# Patient Record
Sex: Male | Born: 1950 | Race: White | Hispanic: No | Marital: Married
Health system: Southern US, Community
[De-identification: ages and names within clinical notes are randomized; demographics above are authoritative.]

---

## 2004-12-08 ENCOUNTER — Emergency Department: Payer: Self-pay | Admitting: Unknown Physician Specialty

## 2013-05-12 ENCOUNTER — Inpatient Hospital Stay: Payer: Self-pay | Admitting: Internal Medicine

## 2013-05-12 LAB — COMPREHENSIVE METABOLIC PANEL
Alkaline Phosphatase: 295 U/L — ABNORMAL HIGH (ref 50–136)
Anion Gap: 10 (ref 7–16)
Bilirubin,Total: 7 mg/dL — ABNORMAL HIGH (ref 0.2–1.0)
Co2: 23 mmol/L (ref 21–32)
Creatinine: 0.99 mg/dL (ref 0.60–1.30)
EGFR (African American): >60
EGFR (Non-African Amer.): >60
Glucose: 222 mg/dL — ABNORMAL HIGH (ref 65–99)
Osmolality: 277 (ref 275–301)
Potassium: 3.8 mmol/L (ref 3.5–5.1)
SGOT(AST): 292 U/L — ABNORMAL HIGH (ref 15–37)
SGPT (ALT): 399 U/L — ABNORMAL HIGH (ref 12–78)
Total Protein: 7.7 g/dL (ref 6.4–8.2)

## 2013-05-12 LAB — CBC WITH DIFFERENTIAL/PLATELET
Basophil #: 0 10*3/uL (ref 0.0–0.1)
Eosinophil #: 0 10*3/uL (ref 0.0–0.7)
Eosinophil %: 0 %
HCT: 48.8 % (ref 40.0–52.0)
HGB: 16.9 g/dL (ref 13.0–18.0)
Lymphocyte #: 1 10*3/uL (ref 1.0–3.6)
Lymphocyte %: 9.9 %
MCV: 92 fL (ref 80–100)
Monocyte #: 0.6 x10 3/mm (ref 0.2–1.0)
Neutrophil #: 9 10*3/uL — ABNORMAL HIGH (ref 1.4–6.5)
Neutrophil %: 84.5 %
RBC: 5.31 10*6/uL (ref 4.40–5.90)
RDW: 14.1 % (ref 11.5–14.5)
WBC: 10.6 10*3/uL (ref 3.8–10.6)

## 2013-05-12 LAB — URINALYSIS, COMPLETE
Blood: NEGATIVE
Glucose,UR: >=500 mg/dL (ref 0–75)
Nitrite: NEGATIVE
Protein: 100
RBC,UR: 4 /HPF (ref 0–5)
WBC UR: 6 /HPF (ref 0–5)

## 2013-05-12 LAB — TROPONIN I: Troponin-I: <0.02 ng/mL

## 2013-05-12 LAB — HEMOGLOBIN A1C: Hemoglobin A1C: 6.1 % (ref 4.2–6.3)

## 2013-05-12 LAB — LIPASE, BLOOD: Lipase: 9583 U/L — ABNORMAL HIGH (ref 73–393)

## 2013-05-13 LAB — CBC WITH DIFFERENTIAL/PLATELET
Eosinophil #: 0 10*3/uL (ref 0.0–0.7)
HGB: 15.4 g/dL (ref 13.0–18.0)
MCV: 92 fL (ref 80–100)
Platelet: 196 10*3/uL (ref 150–440)

## 2013-05-13 LAB — COMPREHENSIVE METABOLIC PANEL
Albumin: 3.6 g/dL (ref 3.4–5.0)
Anion Gap: 10 (ref 7–16)
Bilirubin,Total: 3.4 mg/dL — ABNORMAL HIGH (ref 0.2–1.0)
Co2: 22 mmol/L (ref 21–32)
Glucose: 118 mg/dL — ABNORMAL HIGH (ref 65–99)
Potassium: 3.6 mmol/L (ref 3.5–5.1)
SGOT(AST): 153 U/L — ABNORMAL HIGH (ref 15–37)
SGPT (ALT): 322 U/L — ABNORMAL HIGH (ref 12–78)
Sodium: 139 mmol/L (ref 136–145)

## 2013-05-13 LAB — LIPASE, BLOOD: Lipase: 3842 U/L — ABNORMAL HIGH (ref 73–393)

## 2013-05-14 LAB — COMPREHENSIVE METABOLIC PANEL
Albumin: 3 g/dL — ABNORMAL LOW (ref 3.4–5.0)
Anion Gap: 8 (ref 7–16)
BUN: 10 mg/dL (ref 7–18)
Bilirubin,Total: 2.1 mg/dL — ABNORMAL HIGH (ref 0.2–1.0)
Calcium, Total: 8.2 mg/dL — ABNORMAL LOW (ref 8.5–10.1)
Chloride: 105 mmol/L (ref 98–107)
EGFR (African American): >60
EGFR (Non-African Amer.): >60
Glucose: 106 mg/dL — ABNORMAL HIGH (ref 65–99)
Potassium: 3.5 mmol/L (ref 3.5–5.1)
SGOT(AST): 57 U/L — ABNORMAL HIGH (ref 15–37)
SGPT (ALT): 176 U/L — ABNORMAL HIGH (ref 12–78)
Total Protein: 6.1 g/dL — ABNORMAL LOW (ref 6.4–8.2)

## 2013-05-14 LAB — LIPASE, BLOOD: Lipase: 580 U/L — ABNORMAL HIGH (ref 73–393)

## 2013-05-15 LAB — COMPREHENSIVE METABOLIC PANEL
Albumin: 2.9 g/dL — ABNORMAL LOW (ref 3.4–5.0)
Alkaline Phosphatase: 161 U/L — ABNORMAL HIGH (ref 50–136)
Calcium, Total: 8.9 mg/dL (ref 8.5–10.1)
Creatinine: 0.89 mg/dL (ref 0.60–1.30)
EGFR (Non-African Amer.): >60
Glucose: 88 mg/dL (ref 65–99)
Osmolality: 264 (ref 275–301)
Potassium: 3.3 mmol/L — ABNORMAL LOW (ref 3.5–5.1)
SGOT(AST): 32 U/L (ref 15–37)
Sodium: 133 mmol/L — ABNORMAL LOW (ref 136–145)
Total Protein: 6.8 g/dL (ref 6.4–8.2)

## 2013-05-17 LAB — CULTURE, BLOOD (SINGLE)

## 2013-06-02 ENCOUNTER — Ambulatory Visit: Payer: Self-pay | Admitting: Surgery

## 2013-06-02 LAB — BASIC METABOLIC PANEL
Anion Gap: 6 — ABNORMAL LOW (ref 7–16)
Chloride: 107 mmol/L (ref 98–107)
Co2: 27 mmol/L (ref 21–32)
Creatinine: 1.01 mg/dL (ref 0.60–1.30)
Glucose: 111 mg/dL — ABNORMAL HIGH (ref 65–99)
Osmolality: 279 (ref 275–301)
Potassium: 4.3 mmol/L (ref 3.5–5.1)
Sodium: 140 mmol/L (ref 136–145)

## 2013-06-02 LAB — HEPATIC FUNCTION PANEL A (ARMC)
Alkaline Phosphatase: 84 U/L (ref 50–136)
Bilirubin, Direct: 0.2 mg/dL (ref 0.00–0.20)
SGOT(AST): 31 U/L (ref 15–37)
Total Protein: 7.2 g/dL (ref 6.4–8.2)

## 2013-06-02 LAB — CBC WITH DIFFERENTIAL/PLATELET
Basophil #: 0 10*3/uL (ref 0.0–0.1)
Eosinophil #: 0.2 10*3/uL (ref 0.0–0.7)
Eosinophil %: 2.7 %
HCT: 41.6 % (ref 40.0–52.0)
HGB: 14.4 g/dL (ref 13.0–18.0)
Lymphocyte #: 2.6 10*3/uL (ref 1.0–3.6)
Lymphocyte %: 41.8 %
MCHC: 34.7 g/dL (ref 32.0–36.0)
MCV: 90 fL (ref 80–100)
Monocyte #: 0.5 x10 3/mm (ref 0.2–1.0)
Monocyte %: 8.4 %
Neutrophil #: 2.9 10*3/uL (ref 1.4–6.5)
Platelet: 257 10*3/uL (ref 150–440)
RBC: 4.65 10*6/uL (ref 4.40–5.90)

## 2013-06-05 ENCOUNTER — Ambulatory Visit: Payer: Self-pay | Admitting: Surgery

## 2013-06-06 LAB — PATHOLOGY REPORT

## 2014-11-27 NOTE — Op Note (Signed)
PATIENT NAME:  Lawrence Lawrence, [place]idge MR#:  [id number] DATE OF BIRTH:  07/30/1951  DATE OF PROCEDURE:  06/05/2013  PREOPERATIVE DIAGNOSIS: Biliary pancreatitis and cholelithiasis.   POSTOPERATIVE DIAGNOSIS: Biliary pancreatitis and cholelithiasis.   PROCEDURE PERFORMED:  1. Laparoscopic cholecystectomy.  2. Excision of left lobe of the liver cyst.   SURGEON: Natale LayMark Zira Helinski, M.D.   ASSISTANT: None.   TYPE OF ANESTHESIA: General oral endotracheal   FINDINGS: There was an intense inflammatory reaction in the right upper quadrant. 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   SPECIMENS: 1. Gallbladder with contents.  2.  Cystic structure from the left lobe of the liver.   ESTIMATED BLOOD LOSS: 100 mL.   DRAINS: A 19 [place] Blake drain.   DESCRIPTION OF PROCEDURE: With informed consent, supine position, general oral endotracheal anesthesia was induced. Orogastric tube was placed. The patient's abdomen was clipped of hair, prepped and draped with ChloraPrep solution, right arm was tucked at his side. Timeout was observed.   An infraumbilical transversely oriented skin incision was fashioned with a scalpel and carried down with sharp dissection to the abdominal midline fascia which was incised in the midline and elevated with Kocher clamps. The peritoneum was entered sharply. Stay sutures of 0 Vicryl placed on either side of the abdominal fascia. A 11 mm bladeless trocar was then placed and secured. Pneumoperitoneum was established. The da Vinci 30 degree angled scope was then inserted. Remaining trocars were placed under direct visualization. An 8 mm da Vinci trocar was placed in the left lateral abdomen. An additional 8 mm da Vinci trocar was placed in the right lateral abdomen. A 5 mm assistant port was placed in the right lateral abdomen. The patient cart was then docked. Instruments were then inserted under direct  visualization. I then went to the console.   Operative and findings were as described above. The gallbladder was very thickened and grasped along its fundus and elevated towards the right shoulder. There was rather intense inflammatory response of omentum stuck to the gallbladder, as well as to the left lobe of the liver which was taken down with the da Vinci hook electrocautery. There was a scarified reaction. After approximately 30 minutes at the console, I was not making significant progress in the dissection of the hepatoduodenal ligament. As such, the patient cart was then undocked, and an 11 mm bladeless trocar was placed in the epigastric region and the standard laparoscopy was then performed.   Dissection of the hepatoduodenal ligament demonstrated a cystic duct and cystic artery and a critical view of safety cholecystectomy was achieved. Anterior to the cystic duct was a small lymphatic, as well as a thickened peritoneal fold which were divided between hemoclips. The cystic duct was  triply ligated with the 10 mm clip applier on the portal side, single clip on the gallbladder side. In the process of the dissection the infundibulum was entered. A small amount of bile was spilled. Immediately posterior around the cystic duct lymph node, singly branched cystic artery was identified, doubly clipped on the portal side, singly clipped on the gallbladder side and divided. Further dissection in this area demonstrated no other aberrant bile duct or artery.   The gallbladder was then retrieved off the gallbladder fossa utilizing hook cautery apparatus. With hemostasis being ensured along the gallbladder fossa, it was then cut, placed into an Endo Catch device and retrieved.   The  right upper quadrant was irrigated with a total of 1 liter of warm normal saline during the operation and aspirated dry. Attention was then turned to the cystic mass on the left lobe of the liver which appeared to be inflammatory  with evidence of fat necrosis and saponification. It was grasped with a grasper and elevated towards the anterior abdominal wall. Very filmy inflammatory adhesions were then taken down with blunt technique of the omentum on undersurface of the mass. Thick white fluid was then encountered with rupture of the cyst which made this consistent with a pancreatic pseudocyst. The cyst was then unroofed from its base with sharp dissection and point cautery. The area was then irrigated copiously. The cyst wall on the liver edge was left intact and the inside was cauterized.   The specimen was then captured in an Endo Catch device and retrieved. Inspection of the abdomen demonstrated no evidence of bleeding. A 19 mm Blake drain was directed into Morison's pouch and exited the 5 mm trocar site. Drain site was secured with 4-0 nylon. Ports were then removed under direct visualization. The infraumbilical fascial defect being reapproximated with an additional figure-of-eight #0 Vicryl suture in vertical orientation.  Existing stay sutures tied to each other.   A 4-0 subcuticular stitch was used to reapproximate all skin edges. A total of 20 mL of 0.25% plain Marcaine was infiltrated during the operation. Steri-Strips and occlusive sterile dressings were then placed. The patient was then subsequently extubated and taken to the recovery room in stable and satisfactory condition by anesthesia services.     ____________________________ Redge Gainer Egbert Garibaldi, MD mab:sg D: 06/05/2013 13:40:28 ET T: 06/05/2013 14:03:13 ET JOB#: [id number]  cc: Loraine Leriche A. Egbert Garibaldi, MD, <Dictator> Raynald Kemp MD ELECTRONICALLY SIGNED 06/06/2013 13:58

## 2014-11-27 NOTE — Consult Note (Signed)
Brief Consult Note: Diagnosis: gallstone pancreatitis/choledocholithiasis.   Patient was seen by consultant.   Consult note dictated.   Comments: Mr. Deanne CofferHassell is a very pleasant 64 y/o caucasian male who was in his usual state of good health who began to have severe postprandial abdominal pain.  He is being admitted with acute gallstone pancreatitis, choledocholithiasis on CT, & probable cholangitis.  Lipase 9583, Tbili 7, ALP 295, AST 292 & ALT 399.  CXR shows mild bilateral atelectasis.    Plan: 1) NPO 2) ERCP with sphincterotomy, stone extraction & possible stent with Dr Servando SnareWohl.  RIsks, benefits, alternatives discussed.  Pt/wife agreeable.  Consent to be obtained.  Mcneil SoberK Cox, RN aware.  Discussed with Dr Servando SnareWohl & Dr Monia PouchBandera, anesthesiologist. 3) Aggressive IVFS 4) Supportive measures including antiemetics & pain control  Thanks for consult.  Please see full dictated [phone]#[id number].  Electronic Signatures: Joselyn ArrowJones, Marcianne Ozbun L (NP)  (Signed 06-Oct-14 19:35)  Authored: Brief Consult Note   Last Updated: 06-Oct-14 19:35 by Joselyn ArrowJones, Marc Leichter L (NP)

## 2014-11-27 NOTE — Consult Note (Signed)
PATIENT NAME:  Lawrence Lawrence, Lawrence Lawrence MR#:  [id number] DATE OF BIRTH:  [birth year]  DATE OF CONSULTATION:  05/12/2013  REFERRING PHYSICIAN:  ER.   CONSULTING PHYSICIAN:  Joselyn ArrowKandice L. Nealie Mchatton, NP  ADMITTING PHYSICIAN:  Katharina Caperima Vaickute, MD  FAMILY PHYSICIAN:  Rhona LeavensJames F. Burnett ShengHedrick, MD  REASON FOR CONSULTATION:  Choledocholithiasis/gallstone pancreatitis.   HISTORY OF PRESENT ILLNESS: Lawrence Lawrence is a very pleasant 64 year old Caucasian male who was in his usual state of health. At around 6:30 p.m. yesterday, he developed severe upper abdominal pain, mostly left upper quadrant. He had been taking an antibiotic, Septra, for cellulitis and felt that this was causing him to have nausea and vomiting. The pain became worse after eating dinner. The pain was 10 out of 10 on pain scale. He was admitted with a lipase of 9583, total bilirubin at 7, alkaline phosphatase 299, AST 292, ALT 399. CT of abdomen and pelvis with IV contrast only shows a dilated common bile duct of 1.3 mm, cholelithiasis and a 3 mm stone noted in the distal common bile duct at the level of the ampulla. It also showed changes consistent with acute pancreatitis. He rarely consumes alcohol, occasional glass of wine. He denies any history of heartburn, indigestion, anorexia, weight loss, melena, rectal bleeding. X-ray shows bilateral atelectasis. He had a Doppler of his right lower extremity which was negative for DVT. He did notice dark urine last night and his white blood cell count was 10.6 with an ANC of 9000. He has had a negative troponin.   PAST MEDICAL AND SURGICAL HISTORY: Right lower extremity cellulitis, hiatal hernia.   MEDICATIONS PRIOR TO ADMISSION: Multivitamin daily.   ALLERGIES: No known drug allergies.   FAMILY HISTORY: No known family history of colorectal carcinoma, liver or chronic GI problems.   SOCIAL HISTORY: He is married. He does smoke 4 to 5 cigarettes a day for the last 30+ years. 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  He has 3 grown healthy children. He is married, 4 grandchildren. Denies any illicit drug use and consumes a couple glasses of wine on the weekends.   REVIEW OF SYSTEMS: See history of present illness.  SKIN: He has had cellulitis. Otherwise negative complete 10-point review of systems.   PHYSICAL EXAMINATION: VITAL SIGNS: Temperature 98.2, pulse 91, blood pressure 178/93, respirations 20.  GENERAL: He is a well-developed, well-nourished Caucasian male in no acute distress. His wife is at the bedside.  HEENT: Sclerae with mild icterus. Conjunctivae pink. Oropharynx pink and moist without any lesions.  NECK: Supple without any mass or thyromegaly.  CHEST: Heart regular rate and rhythm. Normal S1, S2. No murmurs, clicks, rubs or gallops.  LUNGS: Clear to auscultation bilaterally.  ABDOMEN: Mildly distended. He has faint bowel sounds. Abdomen is soft. He is tender in the left upper quadrant epigastrium. He does have rebound tenderness. No guarding.  EXTREMITIES: Without edema or clubbing.  MUSCULOSKELETAL: Good equal strength and movement bilaterally.  NEUROLOGIC: Grossly intact.   LABORATORY STUDIES: As per HPI.    IMAGING: See HPI.    IMPRESSION: Lawrence Lawrence is a very pleasant 64 year old Caucasian male who was in his usual state of good health. He began to have severe postprandial abdominal pain. He was being treated with Bactrim for right lower extremity cellulitis. He began to develop nausea and vomiting last night and presented to the Emergency Room today. He is being admitted with acute gallstone pancreatitis, CT shows distal cholelithiasis and probable cholangitis. His lipase was 9583, total  bilirubin 7, alkaline phosphatase 295, AST 292 and ALT 399. His chest x-ray shows mild bilateral atelectasis.   PLAN: 1.  N.p.o. 2.  ERCP with sphincterotomy, stone extraction and possible stent with Dr. Midge Minium as soon as possible. Risks, benefits, and alternatives were  discussed which include but are not limited to bleeding, infection, perforation and risk of pancreatitis. The patient and his wife are both agreeable. Consent will be obtained.  3.  Aggressive IV fluids.  4.  Supportive measures including antiemetics and pain control.   We would like to thank you for allowing [place] to participate in the care of Lawrence Lawrence.    ____________________________ Joselyn Arrow, NP klj:cs D: 05/12/2013 19:45:00 ET T: 05/12/2013 19:56:16 ET JOB#: [id number]  cc: Joselyn Arrow, NP, <Dictator> Rhona Leavens. Burnett Sheng, MD  Joselyn Arrow FNP ELECTRONICALLY SIGNED 05/28/2013 10:28

## 2014-11-27 NOTE — Consult Note (Signed)
Chief Complaint:  Subjective/Chief Complaint Pt denies any pain.  Denies nausea, vomiting or pruritis.  LFTs improving.  Tolerating clear liquids.  1 soft, brown BM in past 24 hrs.   VITAL SIGNS/ANCILLARY NOTES: **Vital Signs.:   09-Oct-14 06:46  Vital Signs Type Routine  Temperature Temperature (F) 98.5  Celsius 36.9  Temperature Source oral  Pulse Pulse 93  Respirations Respirations 18  Systolic BP Systolic BP 334  Diastolic BP (mmHg) Diastolic BP (mmHg) 91  Mean BP 109  Pulse Ox % Pulse Ox % 95  Pulse Ox Activity Level  At rest  Oxygen Delivery Room Air/ 21 %   Brief Assessment:  GEN well developed, well nourished, no acute distress, A/Ox3.   Cardiac Regular   Respiratory normal resp effort   Gastrointestinal Normal   Gastrointestinal details normal Soft  Bowel sounds normal  mild distention, mod RLQ tenderness on palpation   EXTR negative cyanosis/clubbing, negative edema   Additional Physical Exam Skin: mild jaundice HEENT: sclera clear   Lab Results: Hepatic:  09-Oct-14 04:29   Bilirubin, Total  2.0  Alkaline Phosphatase  161  SGPT (ALT)  129  SGOT (AST) 32  Total Protein, Serum 6.8  Albumin, Serum  2.9  Routine Chem:  09-Oct-14 04:29   Glucose, Serum 88  BUN 8  Creatinine (comp) 0.89  Sodium, Serum  133  Potassium, Serum  3.3  Chloride, Serum 99  CO2, Serum 25  Calcium (Total), Serum 8.9  Osmolality (calc) 264  eGFR (African American) >60  eGFR (Non-African American) >60 (eGFR values <61m/min/1.73 m2 may be an indication of chronic kidney disease (CKD). Calculated eGFR is useful in patients with stable renal function. The eGFR calculation will not be reliable in acutely ill patients when serum creatinine is changing rapidly. It is not useful in  patients on dialysis. The eGFR calculation may not be applicable to patients at the low and high extremes of body sizes, pregnant women, and vegetarians.)  Anion Gap 9   Assessment/Plan:   Assessment/Plan:  Assessment Gallstone pancreatitis:  Continues to imporove s/p CBD stone extraction & sphincterotomy.  LFTS continue to trend down. Mild hypokalemia: Per attending.   Plan 1) Continue supportive measures 2) Pt has FU appt with Dr BMarina Gravelfor consideration of cholecystectomy 3) LFTS on monday to assure normalization 4) Pt instructed to call sooner if fever, rash, or recurrent pain. Please call with any questions or concerns.   Electronic Signatures: JAndria Meuse(NP)  (Signed 09-Oct-14 11:27)  Authored: Chief Complaint, VITAL SIGNS/ANCILLARY NOTES, Brief Assessment, Lab Results, Assessment/Plan   Last Updated: 09-Oct-14 11:27 by JAndria Meuse(NP)

## 2014-11-27 NOTE — Consult Note (Signed)
Chief Complaint:  Subjective/Chief Complaint Pt c/o mild upper abdominal pain 2/10.  "I feel much better."  Denies nausea, vomiting or pruritis.  LFTs improving.   VITAL SIGNS/ANCILLARY NOTES: **Vital Signs.:   07-Oct-14 05:13  Temperature Temperature (F) 98.9  Celsius 37.1  Temperature Source oral  Pulse Pulse 100  Respirations Respirations 17  Systolic BP Systolic BP 641  Diastolic BP (mmHg) Diastolic BP (mmHg) 89  Mean BP 111  Pulse Ox % Pulse Ox % 95  Pulse Ox Activity Level  At rest  Oxygen Delivery 2L    11:30  Pulse Ox % Pulse Ox % 91  Pulse Ox Activity Level  At rest  Oxygen Delivery Room Air/ 21 %   Brief Assessment:  GEN well developed, well nourished, no acute distress, A/Ox3.   Cardiac Regular   Respiratory normal resp effort   Gastrointestinal Normal   Gastrointestinal details normal Soft  Nontender  Nondistended  Bowel sounds normal   EXTR negative cyanosis/clubbing, negative edema   Additional Physical Exam Skin: mild jaundice HEENT: mild scleral icterus   Lab Results: Hepatic:  07-Oct-14 04:14   Bilirubin, Total  3.4  Alkaline Phosphatase  247  SGPT (ALT)  322  SGOT (AST)  153  Total Protein, Serum 6.8  Albumin, Serum 3.6  Routine Chem:  07-Oct-14 04:14   Lipase  3842 (Result(s) reported on 13 May 2013 at 12:12PM.)  Glucose, Serum  118  BUN 12  Creatinine (comp) 1.00  Sodium, Serum 139  Potassium, Serum 3.6  Chloride, Serum 107  CO2, Serum 22  Calcium (Total), Serum 8.8  Osmolality (calc) 278  eGFR (African American) >60  eGFR (Non-African American) >60 (eGFR values <61m/min/1.73 m2 may be an indication of chronic kidney disease (CKD). Calculated eGFR is useful in patients with stable renal function. The eGFR calculation will not be reliable in acutely ill patients when serum creatinine is changing rapidly. It is not useful in  patients on dialysis. The eGFR calculation may not be applicable to patients at the low and high extremes  of body sizes, pregnant women, and vegetarians.)  Anion Gap 10  Routine Hem:  07-Oct-14 04:14   WBC (CBC)  12.3  RBC (CBC) 4.82  Hemoglobin (CBC) 15.4  Hematocrit (CBC) 44.5  Platelet Count (CBC) 196  MCV 92  MCH 32.0  MCHC 34.7  RDW 13.7  Neutrophil % 81.9  Lymphocyte % 11.4  Monocyte % 6.2  Eosinophil % 0.4  Basophil % 0.1  Neutrophil #  10.1  Lymphocyte # 1.4  Monocyte # 0.8  Eosinophil # 0.0  Basophil # 0.0 (Result(s) reported on 13 May 2013 at 05:32AM.)   Assessment/Plan:  Assessment/Plan:  Assessment Gallstone pancreatitis:  Significantly improved s/p CBD stone extraction & sphincterotomy.  LFTS trending down.  Lipase improving.   Plan 1) Continue supportive measures 2) Advance to clear liquids 3) DC home once able to tolerate clear liquids if pain is well controlled 4) We will arrange outpatient surgical appt for consideration of cholecystectomy Dr WAllen Norrisexamed patient & is in agreement with this plan. Please call with any questions or concerns. Thanks   Electronic Signatures: JAndria Meuse(NP)  (Signed 07-Oct-14 13:28)  Authored: Chief Complaint, VITAL SIGNS/ANCILLARY NOTES, Brief Assessment, Lab Results, Assessment/Plan   Last Updated: 07-Oct-14 13:28 by JAndria Meuse(NP)

## 2014-11-27 NOTE — H&P (Signed)
PATIENT NAME:  Lawrence Lawrence, Lawrence Lawrence MR#:  [id number] DATE OF BIRTH:  03/16/1951  DATE OF ADMISSION:  05/12/2013  PRIMARY CARE PHYSICIAN: Jeannie Done, MD, from [facility].   HISTORY OF PRESENT ILLNESS:  The patient is a 64 year old Caucasian male whose past medical history is significant for history of recurrent cellulitis, presented to the hospital with complaints of nausea, vomiting. According to the patient, he was doing well up until yesterday at around 6:30 p.m. when he started having severe nausea and vomiting. It started after he ate,  approximately a few hours after he ate.  He also was complaining of pain in the epigastric area. The pain was described as intermittently dull, as well as sharp, but it was so bad that is was rated 10 out of 10 by intensity. It would last for hours. It was radiating to the back, accompanied by nausea, vomiting, as well as dry heaving. There was no hematemesis or hematochezia. The patient had a few bowel movements already. In fact, he has been having problems with diarrhea over the past 1 week since he was initiated on Bactrim for cellulitic changes in his right lower extremity. He had his last bowel movement today in the morning. He described it as dark, however, no black or tarry-looking stools were noted.   PAST MEDICAL HISTORY: Significant for history of recurrent cellulitis. He has been having problems with cellulitis for approximately 11 years now. It usually comes in the right lower extremity. This time, he was diagnosed with cellulitis 5 or 6 days ago. He has been continued on antibiotic, Bactrim, and cellulitic changes are dissipating. No other medical history except for sarcoidosis, which was diagnosed in 69. The patient was never started on any steroids or any other medications. He also had lymph node biopsy during mediastinoscopy in 1974 for the same. No other surgeries.    MEDICATIONS: Bactrim, as well as vitamins.   ALLERGIES: None.   FAMILY  HISTORY: Diabetes mellitus type 1 in the patient's mother. The patient's mother also had coronary artery disease. She had bypass surgery at the age of 66. She is 10 now and she is alive. The patient's father died of MI at the age of 85. No hypertension or early coronary disease, except in the patient's father. No strokes. No cancers.   SOCIAL HISTORY: The patient is married and has 3 children. Smokes approximately 4 to 5 cigarettes a day; used to smoke up to as high as 2 packs of cigarettes a day for the past 35 years. Drinks approximately 2 glasses of wine a day. 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   REVIEW OF SYSTEMS: Positive for feeling fatigued over the past few days, pains in the abdomen. Weight gain over the period of time, nothing acutely. Admits to having some cough with clearish white phlegm, having some ear drainage at night whenever he lies down, as well as abdominal pain, nausea, vomiting which started yesterday; intermittent right knee pains as well as swelling whenever he walks for a longer period of time. Otherwise, denies any fevers, weakness or weight loss.  EYES:  Denies blurry vision, double vision, glaucoma or cataracts.  ENT:  Denies any tinnitus, allergies, epistaxis, sinus pain, dentures or difficulty swallowing.  RESPIRATORY:  Denies any wheezes, asthma, COPD.   CARDIOVASCULAR:  Denies any chest pains, orthopnea, arrhythmias, palpitations and syncope.   GASTROINTESTINAL:  Denies any diarrhea except over the past 5 or 6 days since he was started on Bactrim. No hematemesis,  rectal bleeding. Admits to change in bowel habits.  GENITOURINARY:  No dysuria, hematuria, frequency or incontinence.  ENDOCRINOLOGY: Denies any polydipsia, nocturia or thyroid problems, heat or cold intolerance, or thirst.  HEMATOLOGIC: Denies anemia, easy bruising, bleeding or swollen glands.  SKIN: Denies any acne, rashes, change in moles.  MUSCULOSKELETAL: Denies arthritis, cramps, swelling.   NEUROLOGIC: No numbness, epilepsy or tremor.  PSYCHIATRIC: Denies anxiety, insomnia.    PHYSICAL EXAMINATION: VITAL SIGNS:  On arrival to the hospital, the patient's vital signs were temperature 98.3, pulse 87, respiration rate was 20, blood pressure 177/101, O2 sats 94% on room air.  GENERAL: This is a well-developed, well-nourished, very flushed in the face Caucasian male lying on the stretcher.  HEENT:  Pupils equal, reactive to light. Extraocular muscles intact. The patient does have icterus. No conjunctivitis. Has normal hearing. No pharyngeal erythema. Mucosa is moist.  NECK: No masses. Supple. Nontender. Thyroid is not enlarged. No adenopathy. No JVD or carotid bruits bilaterally. Full range of motion.  LUNGS: Clear to auscultation in all fields. No rales, rhonchi (Dictation Anomaly) diminished breath sounds,  dullness to percussion or overt respiratory distress.  CARDIOVASCULAR: S1, S2 appreciated. No murmurs, rubs, gallops noted. Rhythm is regular, PMI not lateralized.  CHEST: Nontender to palpation. EXTREMITIES: 1+ pedal pulses. No lower extremity edema, calf tenderness or cyanosis was noted.  ABDOMEN: Moderately firm. Tender diffusely, more in the epigastric area, plus voluntary guarding was noted. No hepatosplenomegaly or masses were noted. The patient's bowel sounds were diminished, though present. No Murphy's sign was noted.  MUSCLE STRENGTH: Able to move all extremities. No cyanosis, degenerative joint disease or kyphosis. Gait was not tested.  SKIN: Faint erythema in the right lateral aspect of tibia, just below patella, but laterally to patella. No other lesions. No erythema, nodularity or induration was noted. The patient, however, had interdigital, between toes, erosions of the right foot. Skin was otherwise dry and warm to palpation, moist in the patient's feet.  LYMPHATIC: No adenopathy in the cervical region.  NEUROLOGIC: Cranial nerves grossly intact. Sensory is intact. No  dysarthria or aphasia.  The patient is alert; oriented to time, person and place; cooperative. Memory is good. No significant confusion, agitation or depression noted.   EKG showed normal sinus rhythm at 85 beats per minute, normal axis. No acute ST-T changes were noted.   LABORATORY DATA:  Done in the Emergency Room showed elevation of glucose to 222, sodium 135; otherwise, BMP was unremarkable. The patient's lipase level was high at 9583. The patient's total bilirubin was 7.0, alkaline phosphatase 292, AST and ALT 292 and 399 respectively. Cardiac enzymes were normal. CBC within normal limits with white blood cell count of 10.6, hemoglobin 16.9, platelet count 239. Absolute neutrophil count is high at 9.0. The patient's urinalysis: Red, turbid urine; more than 500 glucose, 1+ bilirubin, trace of ketones, specific gravity was 1.023, pH was 5.0, negative for blood, (Dictation Anomaly) 100 mg/dL protein, negative for nitrites or leukocyte esterase, 4 red blood cells, 6 white blood cells. No bacteria or epithelial cells were noted, 2 transitional epithelial cells as well as mucus was present and 32 hyaline casts as well as amorphous crystals. Lactic acid level was normal at 1.2.   RADIOLOGIC STUDIES: Chest x-ray, portable single view 05/12/2013, showed bilateral lung atelectasis; early infiltrate is not excluded. CT scan of abdomen and pelvis with contrast, 05/12/2013, revealed cholelithiasis, choledocholithiasis. A 3 mm stone was noted in the distal common bile duct blood at the level of (  Dictation Anomaly) ampulla bladder. Associated biliary ductal distention was noted. Changes consistent with pancreatitis were also noted. Atelectasis in both lung bases was noted, as well.   Ultrasound of right lower extremity, Doppler 05/12/2013, revealed no evidence of right lower extremity deep vein thrombosis, no evidence of Baker's cyst as well.   ASSESSMENT AND PLAN: 1.  Acute pancreatitis, biliary pancreatitis.  Admit the patient to medical floor, keep him n.p.o.  Continue IV fluids, pain medications and follow the patient's lipase level in the morning.  2.  Choledocholithiasis, obstructive jaundice. We will start the patient on Zosyn. We will ask gastroenterologist, Dr. Servando SnareWohl, to see the patient today for possible ERCP. We will follow LFTs in the morning.  3.  Cellulitis, right lower extremity - chronic, recurrent. It seems to be resolving. We will stop the patient's Bactrim at this point and will continue the patient on Zosyn. We will also initiate nystatin powder for interdigital area ulcerations. The patient was also explained about the need to control moisture and decrease fungal infections, as well as ulcerations in his feet, to decrease recurrences of right lower extremity cellulitis.  4.  Hyperglycemia. We will get hemoglobin A1c.   5.  Tobacco abuse.  Nicotine replacement therapy will be initiated. Discussed with the patient for approximately 5 minutes.  6.  Alcohol abuse. Watch for withdrawal.   TIME SPENT:   One hour.     ____________________________ Katharina Caperima Rilie Glanz, MD rv:cs D: 05/12/2013 17:34:00 ET T: 05/12/2013 18:33:34 ET JOB#: [id number]  cc: Jeannie DoneLeonard F. Polanco, MD Katharina Caperima Vitoria Conyer, MD, <Dictator>   Ezrie Bunyan MD ELECTRONICALLY SIGNED 06/01/2013 12:27

## 2014-11-27 NOTE — Discharge Summary (Signed)
PATIENT NAME:  Lawrence Lawrence, Lawrence Lawrence MR#:  [id number] DATE OF BIRTH:  04/19/1951  DATE OF ADMISSION:  05/12/2013 DATE OF DISCHARGE:  05/15/2013  ADMITTING DIAGNOSIS:  Acute biliary pancreatitis.   DISCHARGE DIAGNOSES:  1.  Acute biliary pancreatitis.  2.  Choledocholithiasis status post ERCP on the 6th of October 2014 by Dr. Servando Snare.  Choledocholithiasis without cholecystitis was found and removed.   3.  Right lower extremity cellulitis, chronic, recurrent.  4.  Interdigital ulceration, likely site of bacterial entrance.   5.  Hyperglycemia with hemoglobin A1c of 6.1.  No diabetes.   6.  Tobacco abuse, ongoing.  7.  Elevated blood pressure without diagnosis of hypertension.   DISCHARGE CONDITION:  Stable.   DISCHARGE MEDICATIONS:  The patient is to continue:  1.  Tylenol 325 mg 2 tablets every four hours as needed.  2.  Nystatin topical powder twice daily as needed.  3.  Amoxicillin clavulanate 875/125, 1 tablet twice daily.  4.  Pantoprazole 40 mg by mouth daily.  5.  Nicotine oral inhaler 1 inhalation every 1 to 2 hours as needed.  6.  Nicotine transdermal patch 14 mg topically daily.  7.  Multivitamins once daily.  8.  Norco 325/5 mg 1 tablet every six hours as needed.   HOME OXYGEN:  None.   DIET:  2 gram salt, low fat, low cholesterol, carbohydrate-controlled diet.  Soft initially, advance to low fat diet and regular consistency over the next few days.  ACTIVITY LIMITATIONS:  As tolerated.   FOLLOWUP APPOINTMENT:  With Dr. Servando Snare in two days after discharge.  Dr. Burnett Sheng in two days after discharge.  Also Dr. Hilda Blades from [place] [facility].   CONSULTANTS:  Dr. Servando Snare.  Nurse Practitioner Aura Dials, care management.  PROCEDURES:  ERCP 6th of October 2014   RADIOLOGIC STUDIES:  Chest, portable single view, 6th of October 2014, revealed bilateral lung base atelectasis, early infiltrate was not excluded.  CT scan of abdomen and pelvis with contrast, 6th of October 2014 revealed  cholelithiasis, choledocholithiasis, a 3 mm stone was noted in the distal common bile duct at the level of ampulla bladder, associated biliary ductal distention was noted.  Also, changes consistent with pancreatitis.  Right lower extremity Doppler ultrasound to rule out DVT showed no evidence of right lower extremity deep vein thrombosis and no evidence of Baker's cyst.  Repeated chest x-ray PA and lateral 9th of October 2014 showed bilateral lung base atelectasis versus pneumonia.  If there is a history of pancreatitis this would tend more likely secondary to atelectasis according to radiologist.   HISTORY OF PRESENT ILLNESS:  The patient is a 64 year old Caucasian male with past medical history significant for history of smoking, also history of lower extremity recurrent cellulitis who presents to the hospital with complaints of abdominal pain, nausea and vomiting.  Please refer to Dr. Arlys John admission note on 05/12/2013.  On arrival to the Emergency Room, the patient's temperature was 98.3, pulse was 87, respiratory rate was 20, blood pressure 177/101, O2 sats were 94% on room air.  Physical exam revealed painful palpation on upper abdomen as well as voluntary guarding.  Bowel sounds were diminished.  The patient did have faint erythema in her right lateral aspect of tibia, but there are no significant other abnormalities.  EKG was unremarkable.  The patient's lab data revealed markedly elevated lipase level to close to 9583.  The patient's BMP was unremarkable except a sodium level was low at 135 and glucose was elevated at  222.  The patient's liver enzymes also revealed marked elevation of total bilirubin to 7.0, alkaline phosphatase was 295, AST as well as ALT were at 292 and 399, respectively.  Cardiac enzymes were unremarkable.  CBC was within normal limits.  Blood cultures taken on the 6th of October 2014 showed no growth.  Urinalysis revealed 1+ bilirubin, trace ketones, specific gravity 1.023.  The  patient had 4 red blood cells and 6 white blood cells, but no bacteria were seen on urinalysis.  Lactic acid level was normal at 1.2.  EKG showed normal sinus rhythm at 65 beats per minute, normal axis.  No acute ST-T changes were noted.  Chest x-ray was concerning for atelectasis.  CT scan of abdomen with contrast as well as pelvis with contrast were performed which was suggestive of cholelithiasis and choledocholithiasis0  Ultrasound of abdomen was not performed.  The patient was consulted by Dr. Servando SnareWohl who proceeded to do ERCP on the same date 6th of October 2014.  ERCP was successful.  Dr. Servando SnareWohl successfully cannulated bile duct with short (Dictation Anomaly) nosed traction sphincterotome. Contrast was injected.  There was brisk flow of contrast with the ducts.  Image quality was excellent.  Contrast extended entire biliary tree.  The lower third of main bile duct obtained one stone which was approximately 5 mm in diameter.  The main bile duct was mildly dilated with a stone causing an obstruction.  A 0.035 inch straight standard wire was passed into the biliary tree.  A 7 mm biliary sphincterotomy was made with (Dictation Anomaly) Autotome sphincterotome using ERPE electrocautery.  There was no post sphincterotomy bleeding.  The biliary tree was swept with a 15 mm balloon starting at the bifurcation.  One stone was removed.  No scars remained.  Impression, a major papilla appeared to be bulging.  The entire main bile duct was mildly dilated with a stone causing an obstruction.  Choledocholithiasis was found and removed.  Cholelithiasis without cholecystitis and (Dictation Anomaly) with an obstruction was found.  Complete removal was accomplished by biliary sphincterotomy and balloon extraction.  Post procedure patient did well.  His abdominal pain slowly resolved as well as his lipase level as well as liver enzymes, close to normalized.  By the 8th of October 2014, the patient's lipase was down to 580.  The  patient's liver enzymes also showed improvement of his total bilirubin to 2.0.  His alkaline phosphatase on 9th of October 2014 was 161.  AST were within normal limits 32 and ALT was down to 129.  The patient was started on clear liquid diet which was advanced to full liquid diet on the day of discharge, 9th of October 2014.  The patient was advised to continue full liquid diet and advance it to low fat, low cholesterol diet over the next few days depending on his condition and follow up with Dr. Servando SnareWohl in the next few days after discharge.  Also, the patient will be getting surgeon Dr. Molinda BailiffBird's consultation for possible cholecystectomy in the nearest future.  On day of discharge, the patient felt satisfactory, did not complain of any significant discomfort.  His vitals were stable.  His temperature was 98.3, pulse was 93, respiration rate was 20, blood pressure 158/100, O2 sats were 93% to 95% on room air at rest.  The patient is to continue antibiotic therapy for a few more days to complete a 10 day course.  He is to follow up with his primary care physician to manage his  elevated blood pressure which was felt to be likely stress as well as pain related and he is also to follow up and get recommendations in regards to his hyperglycemia, which was noted to be with elevated hemoglobin A1c to 6.1.   TIME SPENT:  40 minutes.    ____________________________ Katharina Caper, MD rv:ea D: 05/15/2013 20:10:36 ET T: 05/16/2013 02:43:19 ET JOB#: [id number]  cc: Katharina Caper, MD, <Dictator> Jeannie Done, MD  Dillie Burandt Winona Legato MD ELECTRONICALLY SIGNED 05/27/2013 23:14

## 2014-11-27 NOTE — Consult Note (Signed)
Chief Complaint:  Subjective/Chief Complaint Pt c/o mild RLQ pain 2/10.  "Feels like gas pains."  Denies nausea, vomiting or pruritis.  LFTs improving.  Tolerating clear liquids.   VITAL SIGNS/ANCILLARY NOTES: **Vital Signs.:   08-Oct-14 05:58  Vital Signs Type Routine  Temperature Temperature (F) 98.5  Celsius 36.9  Temperature Source oral  Pulse Pulse 93  Respirations Respirations 17  Systolic BP Systolic BP 976  Diastolic BP (mmHg) Diastolic BP (mmHg) 96  Mean BP 115  Pulse Ox % Pulse Ox % 93  Pulse Ox Activity Level  At rest  Oxygen Delivery Room Air/ 21 %   Brief Assessment:  GEN well developed, well nourished, no acute distress, A/Ox3.   Cardiac Regular   Respiratory normal resp effort   Gastrointestinal Normal   Gastrointestinal details normal Soft  Bowel sounds normal  mild distention, mod RLQ tenderness on palpation   EXTR negative cyanosis/clubbing, negative edema   Additional Physical Exam Skin: mild jaundice HEENT: mild scleral icterus   Lab Results: Hepatic:  08-Oct-14 04:22   Bilirubin, Total  2.1  Alkaline Phosphatase  174  SGPT (ALT)  176  SGOT (AST)  57  Total Protein, Serum  6.1  Albumin, Serum  3.0  Routine Chem:  08-Oct-14 04:22   BUN 10  Creatinine (comp) 0.91  Sodium, Serum  135  Potassium, Serum 3.5  Chloride, Serum 105  CO2, Serum 22  Calcium (Total), Serum  8.2  Osmolality (calc) 270  eGFR (African American) >60  eGFR (Non-African American) >60 (eGFR values <46m/min/1.73 m2 may be an indication of chronic kidney disease (CKD). Calculated eGFR is useful in patients with stable renal function. The eGFR calculation will not be reliable in acutely ill patients when serum creatinine is changing rapidly. It is not useful in  patients on dialysis. The eGFR calculation may not be applicable to patients at the low and high extremes of body sizes, pregnant women, and vegetarians.)  Anion Gap 8   Assessment/Plan:  Assessment/Plan:   Assessment Gallstone pancreatitis:  Continues to imporove s/p CBD stone extraction & sphincterotomy.  LFTS continue to trend down.   Plan 1) Continue supportive measures 2) May DC home today if able to tolerate clear liquids & PO pain  3) Pt has FU appt with Dr BMarina Gravelfor consideration of cholecystectomy Please call with any questions or concerns.   Electronic Signatures: JAndria Meuse(NP)  (Signed 08-Oct-14 09:09)  Authored: Chief Complaint, VITAL SIGNS/ANCILLARY NOTES, Brief Assessment, Lab Results, Assessment/Plan   Last Updated: 08-Oct-14 09:09 by JAndria Meuse(NP)

## 2015-06-18 IMAGING — CT CT ABD-PELV W/ CM
1 of 3 series · 14 of 32 positions shown, 19 images · non-contrast
Comparison: none

REASON FOR EXAM: (1) abdominal pain  IV contrast ONLY; (2) abdominal pain
IV contrast ONLY
COMMENTS:   May transport without cardiac monitor

PROCEDURE:     CT  - CT ABDOMEN / PELVIS  W  - May 12, 2013  [DATE]
RESULT:     History: Pain.
Comparison study: No prior.
TECHNIQUE: CT obtained with 100 cc of [id number]. Evaluation in 3 dimensions
on separate workstation performed.

[Series 2: soft tissue · axial · 0.83mm/px · z∈[-933,-477]mm · 14 of 174 slices shown, 19 images]
[im 11/174  soft-tissue]
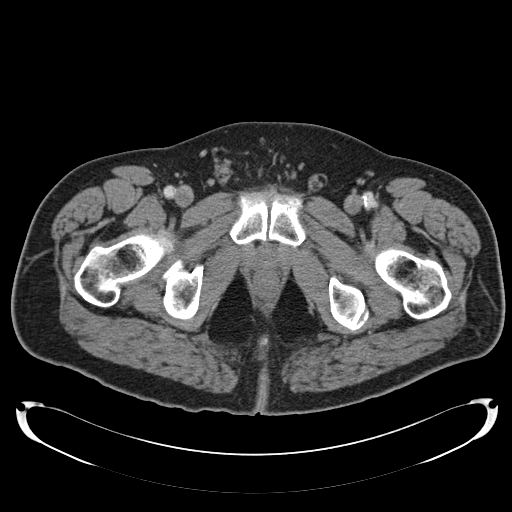
[im 11/174  bone]
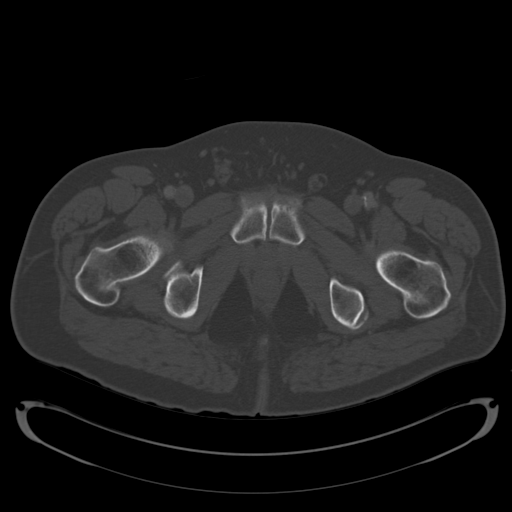
[im 22/174  soft-tissue]
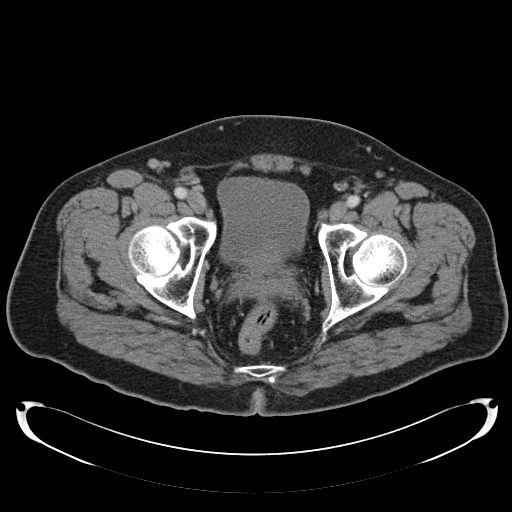
[im 33/174  soft-tissue]
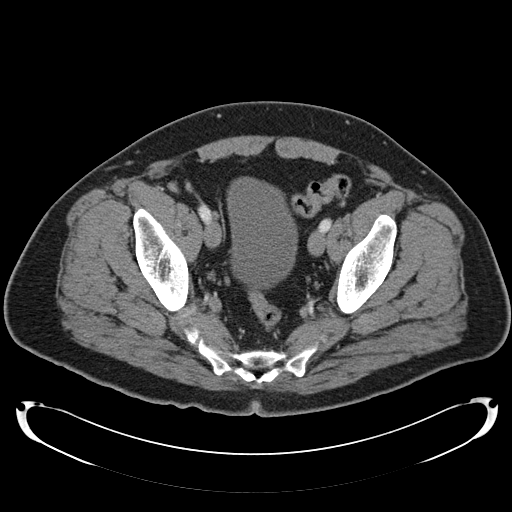
[im 55/174  soft-tissue]
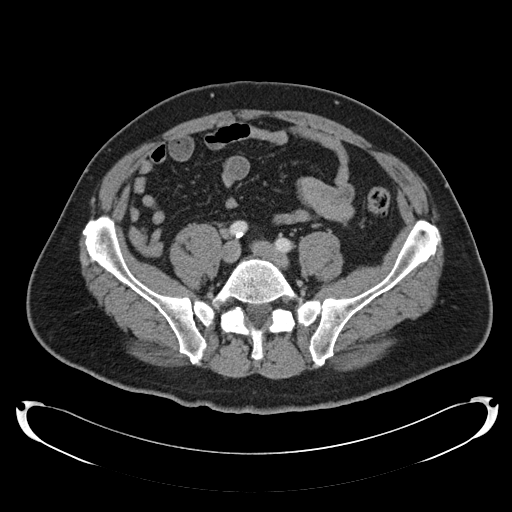
[im 65/174  soft-tissue]
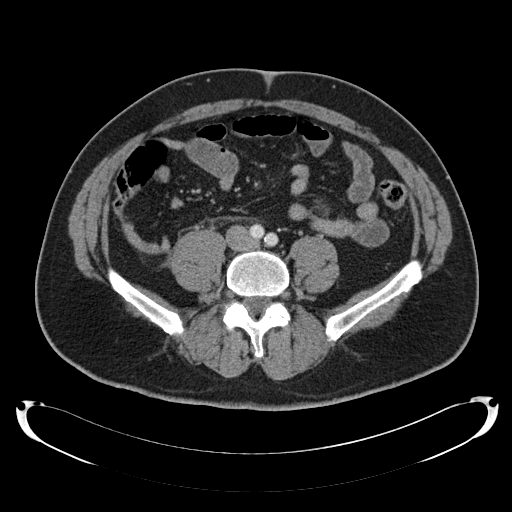
[im 76/174  soft-tissue]
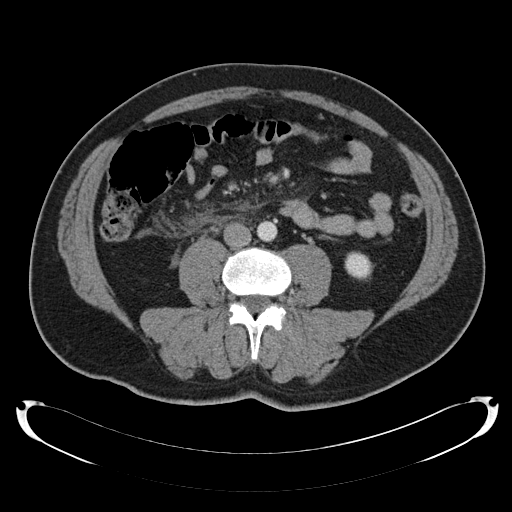
[im 87/174  soft-tissue]
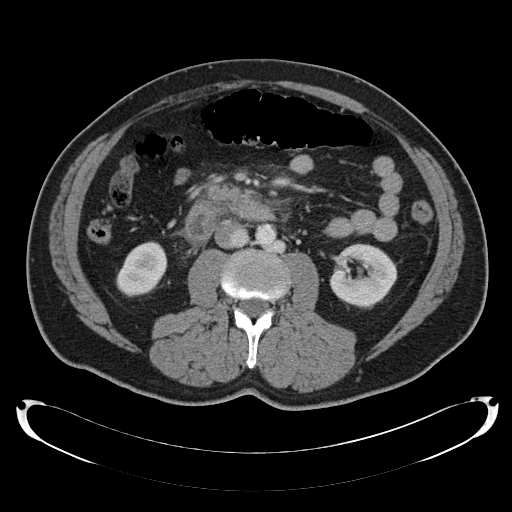
[im 98/174  soft-tissue]
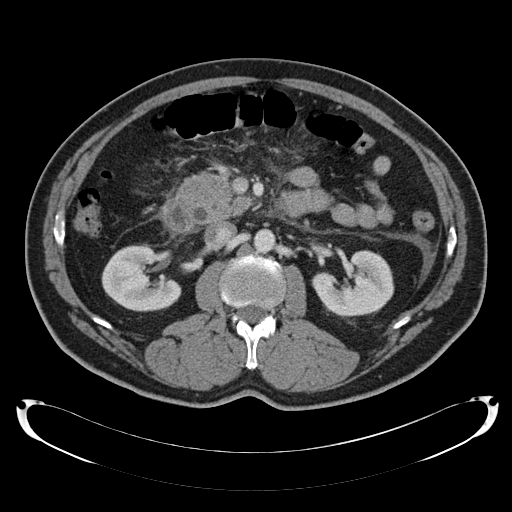
[im 109/174  soft-tissue]
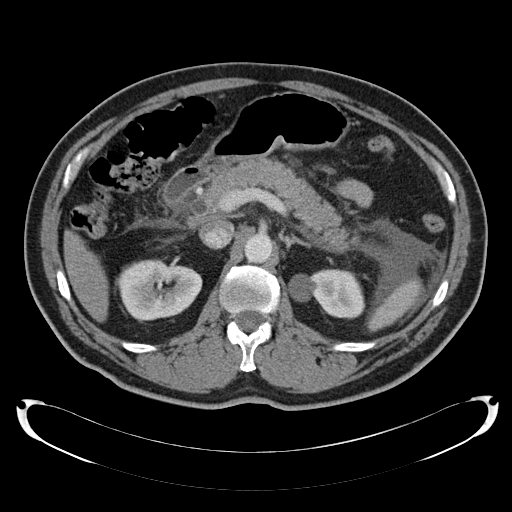
[im 109/174  bone]
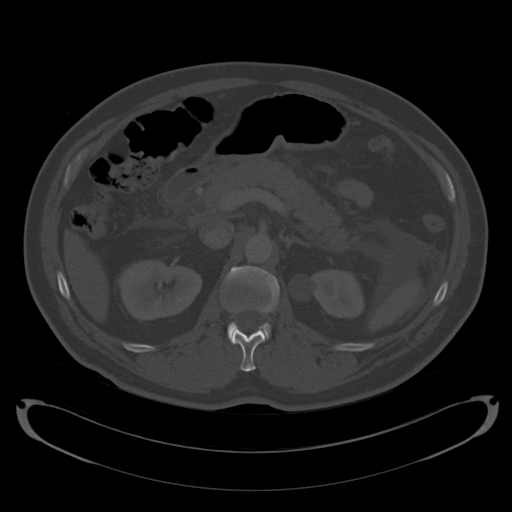
[im 119/174  soft-tissue]
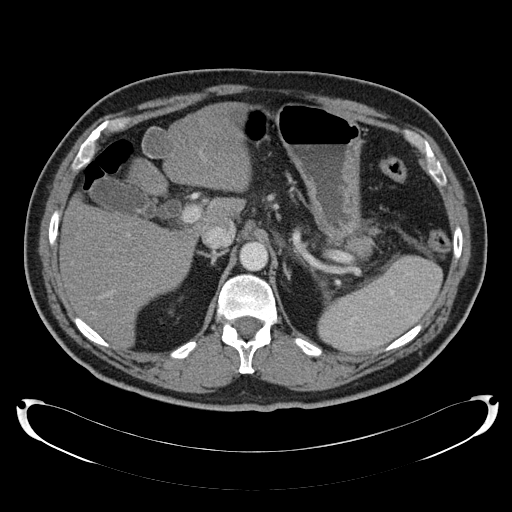
[im 130/174  lung]
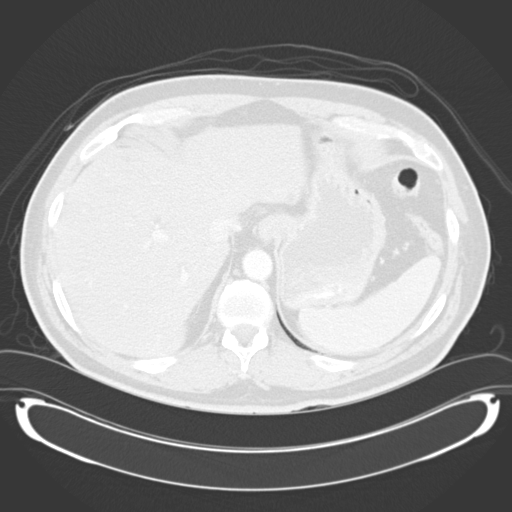
[im 141/174  soft-tissue]
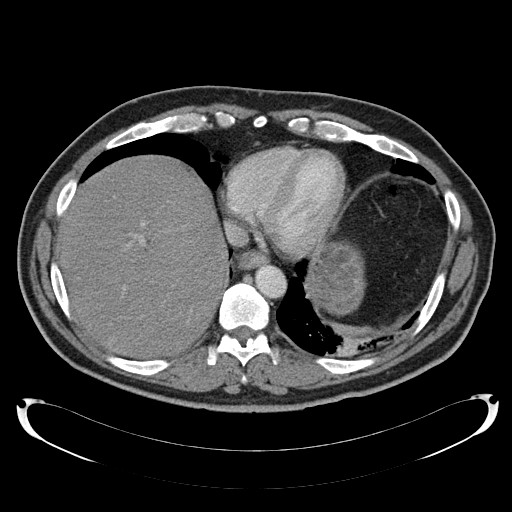
[im 141/174  lung]
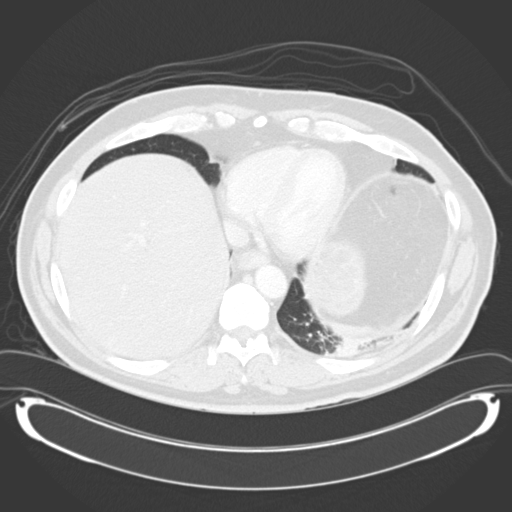
[im 152/174  soft-tissue]
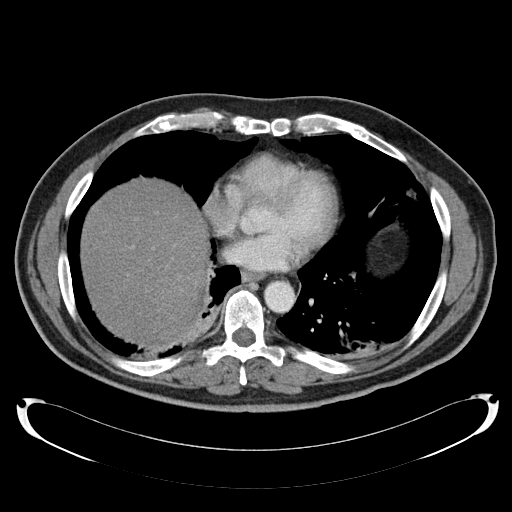
[im 152/174  lung]
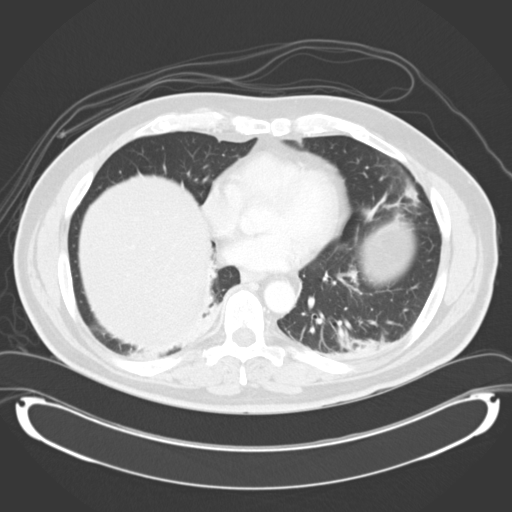
[im 163/174  soft-tissue]
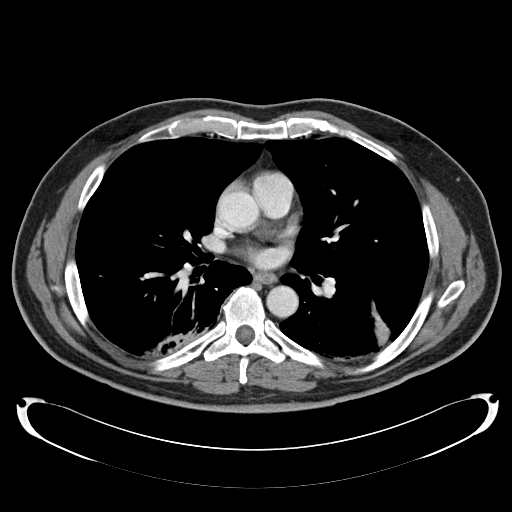
[im 163/174  lung]
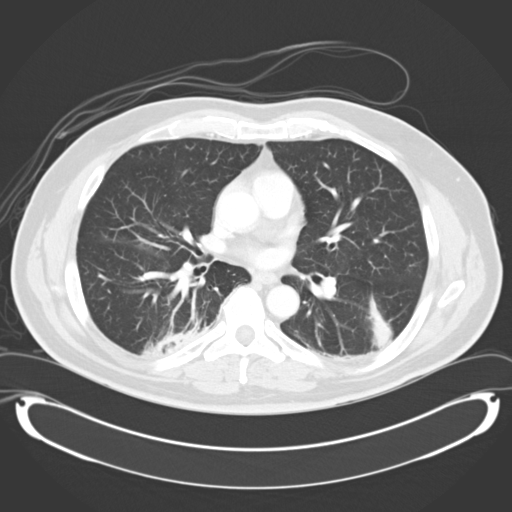

[14 of 32 positions shown; findings below may reference images not displayed]

Findings Focal approxi- 1.4 cm ill-defined nonenhancing lesion is noted in
the right lobe of the liver. This has fat density and is most likely benign
focus of fat. No focal hepatic abnormality otherwise noted. Spleen is
normal. Gallstone noted. Common bile duct is dilated 1.3 cm. Noted in the
region of the distal common bile duct at the ampulla there is a  3 mm
calcification consistent with distal common bile duct stone. Adjacent
peripancreatic inflammatory change noted suggesting the presence of
pancreatitis.

Adrenals are normal. Tiny 2 mm focus of fat in the right kidney consistent
with an angiomyolipoma. Simple left renal cyst. Kidneys otherwise
unremarkable. No hydronephrosis. No obstructing ureteral stone. Bladder is
nondistended. No free pelvic fluid. Prostate unremarkable.

Small inguinal lymph nodes are present. No retroperitoneal lymph nodes
noted. Retroaortic left renal vein is present. Abdominal aorta and its
branches are patent.

Appendix normal. No bowel distention. Stomach is nondistended. Distal
esophagus appears normal.

Coronary artery disease. Heart size normal. Atelectasis both lung bases. No
free air.
IMPRESSION: 1. Cholelithiasis.
2. Choledocholithiasis. A 3 mm stone is noted in the distal common bile duct
at the level of the ampulla bladder. Associated biliary ductal distention.
3. Changes consistent with pancreatitis.

## 2023-11-15 ENCOUNTER — Other Ambulatory Visit: Payer: Self-pay | Admitting: Student

## 2023-11-15 DIAGNOSIS — H61303 Acquired stenosis of external ear canal, unspecified, bilateral: Secondary | ICD-10-CM

## 2023-11-15 DIAGNOSIS — H906 Mixed conductive and sensorineural hearing loss, bilateral: Secondary | ICD-10-CM
